# Patient Record
Sex: Male | Born: 1995 | Race: White | Hispanic: No
Health system: Southern US, Community
[De-identification: ages and names within clinical notes are randomized; demographics above are authoritative.]

---
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
## 2016-07-16 ENCOUNTER — Ambulatory Visit
Admission: EM | Admit: 2016-07-16 | Discharge: 2016-07-16 | Discharge status: Absent without leave | Payer: BLUE CROSS/BLUE SHIELD

## 2019-05-23 ENCOUNTER — Ambulatory Visit: Payer: Self-pay

## 2019-05-23 DIAGNOSIS — Z23 Encounter for immunization: Secondary | ICD-10-CM

## 2020-01-17 ENCOUNTER — Ambulatory Visit: Payer: Self-pay | Attending: Internal Medicine

## 2020-01-17 DIAGNOSIS — Z23 Encounter for immunization: Secondary | ICD-10-CM

## 2020-01-17 NOTE — Progress Notes (Signed)
   Covid-19 Vaccination Clinic  Name:  Samuel Graves    MRN: [id number] DOB: 1996/02/02  01/17/2020  [name] was observed post Covid-19 immunization for 15 minutes without incident. He was provided with Vaccine Information Sheet and instruction to access the V-Safe system.   [name] was instructed to call 911 with any severe reactions post vaccine: Marland Kitchen Difficulty breathing  . Swelling of face and throat  . A fast heartbeat  . A bad rash all over body  . Dizziness and weakness   Immunizations Administered    Name Date Dose VIS Date Route   Pfizer COVID-19 Vaccine 01/17/2020  9:04 AM 0.3 mL 10/15/2018 Intramuscular   Manufacturer: [employer], [employer]   Lot: [id number]   NDC: [address]-6

## 2020-02-09 ENCOUNTER — Ambulatory Visit: Payer: Self-pay | Attending: Internal Medicine

## 2020-02-09 DIAGNOSIS — Z23 Encounter for immunization: Secondary | ICD-10-CM

## 2020-02-09 NOTE — Progress Notes (Signed)
   Covid-19 Vaccination Clinic  Name:  Emilian Stawicki    MRN: [id number] DOB: [birth year]  02/09/2020  Mr. Schaberg was observed post Covid-19 immunization for 15 minutes without incident. He was provided with Vaccine Information Sheet and instruction to access the V-Safe system.   Mr. Iodice was instructed to call 911 with any severe reactions post vaccine: Marland Kitchen Difficulty breathing  . Swelling of face and throat  . A fast heartbeat  . A bad rash all over body  . Dizziness and weakness   Immunizations Administered    Name Date Dose VIS Date Route   Pfizer COVID-19 Vaccine 02/09/2020  8:58 AM 0.3 mL 10/15/2018 Intramuscular   Manufacturer: [employer], [employer]   Lot: [id number]   NDC: [address]-3

## 2020-07-07 ENCOUNTER — Ambulatory Visit: Payer: Self-pay

## 2020-07-07 DIAGNOSIS — Z23 Encounter for immunization: Secondary | ICD-10-CM

## 2022-12-08 ENCOUNTER — Ambulatory Visit
Admission: RE | Admit: 2022-12-08 | Discharge: 2022-12-08 | Disposition: A | Discharge status: Home / Self Care | Payer: Managed Care, Other (non HMO) | Source: Ambulatory Visit | Attending: Emergency Medicine

## 2022-12-08 ENCOUNTER — Ambulatory Visit (INDEPENDENT_AMBULATORY_CARE_PROVIDER_SITE_OTHER): Payer: Managed Care, Other (non HMO)

## 2022-12-08 VITALS — BP 126/85 | HR 81 | Temp 98.6°F | Resp 18

## 2022-12-08 DIAGNOSIS — R079 Chest pain, unspecified: Secondary | ICD-10-CM

## 2022-12-08 DIAGNOSIS — R0789 Other chest pain: Secondary | ICD-10-CM | POA: Diagnosis not present

## 2022-12-08 MED ORDER — METHOCARBAMOL 500 MG PO TABS: 500.0000 mg | ORAL_TABLET | Freq: Two times a day (BID) | ORAL | 0 refills | Status: DC

## 2022-12-08 MED ORDER — IBUPROFEN 600 MG PO TABS: 600.0000 mg | ORAL_TABLET | Freq: Four times a day (QID) | ORAL | 0 refills | Status: DC | PRN

## 2022-12-08 NOTE — Discharge Instructions (Addendum)
Go to the emergency department if your have persistent or worsening symptoms.    Take the ibuprofen as needed for discomfort.  Take the muscle relaxer as needed for muscle spasm; Do not drive, operate machinery, or drink alcohol with this medication as it can cause drowsiness.   Follow up with your primary care provider on Monday.

## 2022-12-08 NOTE — ED Provider Notes (Signed)
[facility]    CSN: [id number] Arrival date & time: 12/08/22  0944      History   Chief Complaint Chief Complaint  Patient presents with   Chest Injury    Entered by patient    HPI Samuel Graves is a 27 y.o. male.  Patient presents with anterior chest pain which started when he reached for a ball while running in a kickball game.  He did not fall or have any trauma to the chest.   While reaching down for the ball, he felt his chest muscles tighten up and has had chest pain since then.  The pain is worse with movement and palpation.  No bruises, wounds, rash, shortness of breath, cough, fever, or other symptoms.  No treatment attempted.  No pertinent medical history.    The history is provided by the patient and medical records.    History reviewed. No pertinent past medical history.  There are no problems to display for this patient.   History reviewed. No pertinent surgical history.     Home Medications    Prior to Admission medications   Medication Sig Start Date End Date Taking? Authorizing Provider  ibuprofen (ADVIL) 600 MG tablet Take 1 tablet (600 mg total) by mouth every 6 (six) hours as needed. 12/08/22  Yes Mickie Bail, NP  methocarbamol (ROBAXIN) 500 MG tablet Take 1 tablet (500 mg total) by mouth 2 (two) times daily. 12/08/22  Yes Mickie Bail, NP  ciprofloxacin (CIPRO) 500 MG tablet Take 1 tablet (500 mg total) by mouth 2 (two) times daily. If needed for traveler's diarrhea ( if 3 or more loose stools/24hr) Patient not taking: Reported on 12/08/2022 10/11/12   Judyann Munson, MD  doxycycline (VIBRA-TABS) 100 MG tablet Take 1 tablet (100 mg total) by mouth 2 (two) times daily. Patient not taking: Reported on 12/08/2022 10/11/12   Judyann Munson, MD    Family History History reviewed. No pertinent family history.  Social History Social History   Tobacco Use   Smoking status: Never   Smokeless tobacco: Never  Substance Use Topics    Alcohol use: Not Currently   Drug use: Not Currently     Allergies   Patient has no known allergies.   Review of Systems Review of Systems  Constitutional:  Negative for chills and fever.  Respiratory:  Negative for cough and shortness of breath.   Cardiovascular:  Positive for chest pain. Negative for palpitations.  Gastrointestinal:  Negative for abdominal pain, nausea and vomiting.  Musculoskeletal:  Positive for myalgias. Negative for arthralgias, back pain and gait problem.  Skin:  Negative for color change, rash and wound.  Neurological:  Negative for weakness and numbness.  All other systems reviewed and are negative.    Physical Exam Triage Vital Signs ED Triage Vitals [12/08/22 0953]  Enc Vitals Group     BP      Pulse Rate 81     Resp 18     Temp 98.6 F (37 C)     Temp src      SpO2 98 %     Weight      Height      Head Circumference      Peak Flow      Pain Score      Pain Loc      Pain Edu?      Excl. in GC?    No data found.  Updated Vital Signs BP 126/85  Pulse 81   Temp 98.6 F (37 C)   Resp 18   SpO2 98%   Visual Acuity Right Eye Distance:   Left Eye Distance:   Bilateral Distance:    Right Eye Near:   Left Eye Near:    Bilateral Near:     Physical Exam Vitals and nursing note reviewed.  Constitutional:      General: He is not in acute distress.    Appearance: Normal appearance. He is well-developed. He is not ill-appearing.  HENT:     Right Ear: Tympanic membrane normal.     Left Ear: Tympanic membrane normal.     Nose: Nose normal.     Mouth/Throat:     Mouth: Mucous membranes are moist.     Pharynx: Oropharynx is clear.  Cardiovascular:     Rate and Rhythm: Normal rate and regular rhythm.     Heart sounds: Normal heart sounds.  Pulmonary:     Effort: Pulmonary effort is normal. No respiratory distress.     Breath sounds: Normal breath sounds. No wheezing, rhonchi or rales.  Chest:     Chest wall: Tenderness present.     Abdominal:     General: Bowel sounds are normal.     Palpations: Abdomen is soft.     Tenderness: There is no abdominal tenderness. There is no guarding or rebound.  Musculoskeletal:        General: Tenderness present. No swelling, deformity or signs of injury. Normal range of motion.     Cervical back: Neck supple.  Skin:    General: Skin is warm and dry.     Capillary Refill: Capillary refill takes less than 2 seconds.     Findings: No bruising, erythema, lesion or rash.  Neurological:     General: No focal deficit present.     Mental Status: He is alert and oriented to person, place, and time.     Sensory: No sensory deficit.     Motor: No weakness.     Gait: Gait normal.  Psychiatric:        Mood and Affect: Mood normal.        Behavior: Behavior normal.      UC Treatments / Results  Labs (all labs ordered are listed, but only abnormal results are displayed) Labs Reviewed - No data to display  EKG   Radiology DG Chest 2 View  Result Date: 12/08/2022 CLINICAL DATA:  chest wall pain (sternum) EXAM: CHEST - 2 VIEW COMPARISON:  None Available. FINDINGS: The heart size and mediastinal contours are within normal limits. Both lungs are clear. No visible pleural effusions or pneumothorax. No acute osseous abnormality. IMPRESSION: No active cardiopulmonary disease. Electronically Signed   By: Feliberto Harts M.D.   On: 12/08/2022 10:27    Procedures Procedures (including critical care time)  Medications Ordered in UC Medications - No data to display  Initial Impression / Assessment and Plan / UC Course  I have reviewed the triage vital signs and the nursing notes.  Pertinent labs & imaging results that were available during my care of the patient were reviewed by me and considered in my medical decision making (see chart for details).   Anterior chest wall pain.  No falls or trauma.  Chest pain is reproducible with palpation of anterior chest wall.   EKG shows sinus  rhythm, rate 81, no ST elevation, inverted T waves in leads III and aVF, no previous to compare.  CXR negative.  Treating with  ibuprofen and Robaxin.  ED precautions discussed; patient declines transfer to the ED at this time.  Education provided on chest pain and chest wall pain.  Instructed patient to follow up with his PCP.  He agrees to plan of care.    Final Clinical Impressions(s) / UC Diagnoses   Final diagnoses:  Anterior chest wall pain  Chest pain, unspecified type     Discharge Instructions      Go to the emergency department if your have persistent or worsening symptoms.    Take the ibuprofen as needed for discomfort.  Take the muscle relaxer as needed for muscle spasm; Do not drive, operate machinery, or drink alcohol with this medication as it can cause drowsiness.   Follow up with your primary care provider on Monday.         ED Prescriptions     Medication Sig Dispense Auth. Provider   ibuprofen (ADVIL) 600 MG tablet Take 1 tablet (600 mg total) by mouth every 6 (six) hours as needed. 30 tablet Mickie Bail, NP   methocarbamol (ROBAXIN) 500 MG tablet Take 1 tablet (500 mg total) by mouth 2 (two) times daily. 20 tablet Mickie Bail, NP      PDMP not reviewed this encounter.   Mickie Bail, NP 12/08/22 1032

## 2022-12-08 NOTE — ED Triage Notes (Addendum)
Patient to Urgent Care with complaints of chest injury that occurred last night when playing kick ball. Has also been sick with a cough/ sinus pain and pressure.  States that he reached down and felt a spasm in the center of his chest. Describes when moving he feels spasms of pain in the center of his chest. Reports a constant dull pain that worsens with movement.

## 2023-06-28 ENCOUNTER — Ambulatory Visit
Admission: RE | Admit: 2023-06-28 | Discharge: 2023-06-28 | Disposition: A | Discharge status: Home / Self Care | Payer: Managed Care, Other (non HMO) | Source: Ambulatory Visit | Attending: Emergency Medicine | Admitting: Emergency Medicine

## 2023-06-28 VITALS — BP 132/84 | HR 89 | Temp 99.1°F | Ht 72.0 in | Wt 208.0 lb

## 2023-06-28 DIAGNOSIS — J069 Acute upper respiratory infection, unspecified: Secondary | ICD-10-CM

## 2023-06-28 LAB — GROUP A STREP BY PCR: Group A Strep by PCR: NOT DETECTED

## 2023-06-28 MED ORDER — IPRATROPIUM BROMIDE 0.06 % NA SOLN: 2.0000 | Freq: Four times a day (QID) | NASAL | 12 refills | Status: AC

## 2023-06-28 NOTE — Discharge Instructions (Addendum)
Your strep test today was negative.  I do believe that your sore throat is being caused by postnasal drip as a result of a viral upper respiratory infection.  Use the Atrovent nasal spray, 2 squirts up each nostril every 6 hours, as needed for congestion and postnasal drip.  Gargle with warm salt water to help wash the drainage away from the back of your throat and soothe the tissues.  You may also use over-the-counter Chloraseptic or Sucrets lozenges for sore throat pain relief.  Over-the-counter Tylenol and/or ibuprofen according to the package instructions as needed for any pain.  Please return for reevaluation, or see your PCP, for any continued or worsening symptoms.

## 2023-06-28 NOTE — ED Triage Notes (Signed)
Pt c/o sore throat, fatigue, nasal drainagex3days  Pt asks for a strep test.   Pt has used OTC cough syrup for symptoms.

## 2023-06-28 NOTE — ED Provider Notes (Signed)
[facility]    CSN: [id number] Arrival date & time: 06/28/23  1242      History   Chief Complaint Chief Complaint  Patient presents with   Sore Throat    HPI Samuel Graves is a 27 y.o. male.   HPI  27 year old male with no significant past medical history presents for evaluation of 3 days worth of URI symptoms which include fatigue, runny nose for clear nasal discharge, postnasal drip, and sore throat.  He denies any cough or fever.  He is requesting a strep test though he is unaware of any sick contacts.  He denies recent travel.  History reviewed. No pertinent past medical history.  There are no problems to display for this patient.   History reviewed. No pertinent surgical history.     Home Medications    Prior to Admission medications   Medication Sig Start Date End Date Taking? Authorizing Provider  ipratropium (ATROVENT) 0.06 % nasal spray Place 2 sprays into both nostrils 4 (four) times daily. 06/28/23  Yes Becky Augusta, NP    Family History History reviewed. No pertinent family history.  Social History Social History   Tobacco Use   Smoking status: Never   Smokeless tobacco: Never  Vaping Use   Vaping status: Never Used  Substance Use Topics   Alcohol use: Not Currently   Drug use: Not Currently     Allergies   Patient has no known allergies.   Review of Systems Review of Systems  Constitutional:  Positive for fatigue. Negative for fever.  HENT:  Positive for congestion, postnasal drip, rhinorrhea and sore throat. Negative for ear pain.   Respiratory:  Negative for cough.      Physical Exam Triage Vital Signs ED Triage Vitals  Encounter Vitals Group     BP      Systolic BP Percentile      Diastolic BP Percentile      Pulse      Resp      Temp      Temp src      SpO2      Weight      Height      Head Circumference      Peak Flow      Pain Score      Pain Loc      Pain Education      Exclude from  Growth Chart    No data found.  Updated Vital Signs BP 132/84 (BP Location: Left Arm)   Pulse 89   Temp 99.1 F (37.3 C) (Oral)   Ht 6' (1.829 m)   Wt 208 lb (94.3 kg)   SpO2 97%   BMI 28.21 kg/m   Visual Acuity Right Eye Distance:   Left Eye Distance:   Bilateral Distance:    Right Eye Near:   Left Eye Near:    Bilateral Near:     Physical Exam Vitals and nursing note reviewed.  Constitutional:      Appearance: Normal appearance. He is not ill-appearing.  HENT:     Head: Normocephalic and atraumatic.     Nose: Congestion and rhinorrhea present.     Comments: Nasal mucosa is erythematous and edematous with clear discharge in both nares.    Mouth/Throat:     Mouth: Mucous membranes are moist.     Pharynx: Oropharynx is clear. Posterior oropharyngeal erythema present. No oropharyngeal exudate.     Comments: Bilateral tonsillar pillars are erythematous and edematous but  free of exudate.  Posterior oropharynx demonstrates erythema and injection with clear postnasal drip. Cardiovascular:     Rate and Rhythm: Normal rate and regular rhythm.     Pulses: Normal pulses.     Heart sounds: Normal heart sounds. No murmur heard.    No friction rub. No gallop.  Pulmonary:     Effort: Pulmonary effort is normal.     Breath sounds: Normal breath sounds. No wheezing, rhonchi or rales.  Musculoskeletal:     Cervical back: Normal range of motion and neck supple.  Lymphadenopathy:     Cervical: No cervical adenopathy.  Skin:    General: Skin is warm and dry.     Capillary Refill: Capillary refill takes less than 2 seconds.     Findings: No rash.  Neurological:     General: No focal deficit present.     Mental Status: He is alert and oriented to person, place, and time.      UC Treatments / Results  Labs (all labs ordered are listed, but only abnormal results are displayed) Labs Reviewed  GROUP A STREP BY PCR    EKG   Radiology No results  found.  Procedures Procedures (including critical care time)  Medications Ordered in UC Medications - No data to display  Initial Impression / Assessment and Plan / UC Course  I have reviewed the triage vital signs and the nursing notes.  Pertinent labs & imaging results that were available during my care of the patient were reviewed by me and considered in my medical decision making (see chart for details).   Patient is a nontoxic-appearing 27 year old male presenting for evaluation of 3 days worth of URI symptoms as outlined HPI above.  He does have inflamed nasal mucosa with clear rhinorrhea and clear postnasal drip on exam.  His tonsillar pillars are erythematous and edematous but free of exudate.  No cervical lymphadenopathy present on exam.  Given that patient is experiencing a sore throat I will order a strep PCR though I do suspect is most likely from his postnasal drip.  Strep PCR is negative.  I will discharge patient home with a diagnosis of viral URI and prescribe Atrovent nasal spray to help with the nasal congestion and postnasal drip.  He may use over-the-counter Tylenol and/or ibuprofen according to pack instructions needed for pain.  He may also use salt water gargles or over-the-counter Chloraseptic or Sucrets lozenges to help soothe his throat.   Final Clinical Impressions(s) / UC Diagnoses   Final diagnoses:  Viral upper respiratory tract infection     Discharge Instructions      Your strep test today was negative.  I do believe that your sore throat is being caused by postnasal drip as a result of a viral upper respiratory infection.  Use the Atrovent nasal spray, 2 squirts up each nostril every 6 hours, as needed for congestion and postnasal drip.  Gargle with warm salt water to help wash the drainage away from the back of your throat and soothe the tissues.  You may also use over-the-counter Chloraseptic or Sucrets lozenges for sore throat pain  relief.  Over-the-counter Tylenol and/or ibuprofen according to the package instructions as needed for any pain.  Please return for reevaluation, or see your PCP, for any continued or worsening symptoms.     ED Prescriptions     Medication Sig Dispense Auth. Provider   ipratropium (ATROVENT) 0.06 % nasal spray Place 2 sprays into both nostrils 4 (four) times  daily. 15 mL Becky Augusta, NP      PDMP not reviewed this encounter.   Becky Augusta, NP 06/28/23 1345
